# Patient Record
Sex: Male | Born: 1950 | Race: White | Hispanic: No | Marital: Single | Smoking: Former smoker
Health system: Southern US, Community
[De-identification: ages and names within clinical notes are randomized; demographics above are authoritative.]

---

## 2008-05-30 ENCOUNTER — Emergency Department (HOSPITAL_COMMUNITY): Admission: EM | Admit: 2008-05-30 | Discharge: 2008-05-30 | Payer: Self-pay | Admitting: Internal Medicine

## 2010-06-12 ENCOUNTER — Observation Stay (HOSPITAL_COMMUNITY)
Admission: EM | Admit: 2010-06-12 | Discharge: 2010-06-14 | Payer: Self-pay | Source: Home / Self Care | Attending: Internal Medicine | Admitting: Internal Medicine

## 2010-06-15 LAB — PROTIME-INR
INR: 1.03 (ref 0.00–1.49)
Prothrombin Time: 13.7 seconds (ref 11.6–15.2)

## 2010-06-15 LAB — HEMOGLOBIN A1C
Hgb A1c MFr Bld: 6.2 % — ABNORMAL HIGH (ref <5.7)
Mean Plasma Glucose: 131 mg/dL — ABNORMAL HIGH (ref <117)

## 2010-06-15 LAB — URINALYSIS, ROUTINE W REFLEX MICROSCOPIC
Bilirubin Urine: NEGATIVE
Hgb urine dipstick: NEGATIVE
Ketones, ur: NEGATIVE mg/dL
Nitrite: NEGATIVE
Protein, ur: NEGATIVE mg/dL
Specific Gravity, Urine: 1.027 (ref 1.005–1.030)
Urine Glucose, Fasting: NEGATIVE mg/dL
Urobilinogen, UA: 1 mg/dL (ref 0.0–1.0)
pH: 7 (ref 5.0–8.0)

## 2010-06-15 LAB — POCT CARDIAC MARKERS
CKMB, poc: 2.7 ng/mL (ref 1.0–8.0)
CKMB, poc: 4.7 ng/mL (ref 1.0–8.0)
Myoglobin, poc: 140 ng/mL (ref 12–200)
Myoglobin, poc: 131 ng/mL (ref 12–200)
Troponin i, poc: <0.05 ng/mL (ref 0.00–0.09)
Troponin i, poc: <0.05 ng/mL (ref 0.00–0.09)

## 2010-06-15 LAB — CBC
HCT: 45.1 % (ref 39.0–52.0)
Hemoglobin: 15 g/dL (ref 13.0–17.0)
MCH: 31.7 pg (ref 26.0–34.0)
MCHC: 33.3 g/dL (ref 30.0–36.0)
MCV: 95.3 fL (ref 78.0–100.0)
Platelets: 184 10*3/uL (ref 150–400)
RBC: 4.73 MIL/uL (ref 4.22–5.81)
RDW: 13.5 % (ref 11.5–15.5)
WBC: 6.8 10*3/uL (ref 4.0–10.5)

## 2010-06-15 LAB — COMPREHENSIVE METABOLIC PANEL
ALT: 23 U/L (ref 0–53)
AST: 25 U/L (ref 0–37)
Albumin: 3.7 g/dL (ref 3.5–5.2)
Alkaline Phosphatase: 44 U/L (ref 39–117)
BUN: 16 mg/dL (ref 6–23)
CO2: 30 mEq/L (ref 19–32)
Calcium: 9.2 mg/dL (ref 8.4–10.5)
Chloride: 104 mEq/L (ref 96–112)
Creatinine, Ser: 0.94 mg/dL (ref 0.4–1.5)
GFR calc Af Amer: >60 mL/min (ref >60)
GFR calc non Af Amer: >60 mL/min (ref >60)
Glucose, Bld: 106 mg/dL — ABNORMAL HIGH (ref 70–99)
Potassium: 4 mEq/L (ref 3.5–5.1)
Sodium: 141 mEq/L (ref 135–145)
Total Bilirubin: 0.5 mg/dL (ref 0.3–1.2)
Total Protein: 7.4 g/dL (ref 6.0–8.3)

## 2010-06-15 LAB — CARDIAC PANEL(CRET KIN+CKTOT+MB+TROPI)
CK, MB: 3.4 ng/mL (ref 0.3–4.0)
CK, MB: 3.2 ng/mL (ref 0.3–4.0)
CK, MB: 2.6 ng/mL (ref 0.3–4.0)
Relative Index: 1.5 (ref 0.0–2.5)
Relative Index: 1.6 (ref 0.0–2.5)
Relative Index: 1.5 (ref 0.0–2.5)
Total CK: 220 U/L (ref 7–232)
Total CK: 200 U/L (ref 7–232)
Total CK: 172 U/L (ref 7–232)
Troponin I: 0.01 ng/mL (ref 0.00–0.06)
Troponin I: 0.01 ng/mL (ref 0.00–0.06)
Troponin I: 0.01 ng/mL (ref 0.00–0.06)

## 2010-06-15 LAB — DIFFERENTIAL
Basophils Absolute: 0 10*3/uL (ref 0.0–0.1)
Basophils Relative: 0 % (ref 0–1)
Eosinophils Absolute: 0.2 10*3/uL (ref 0.0–0.7)
Eosinophils Relative: 3 % (ref 0–5)
Lymphocytes Relative: 33 % (ref 12–46)
Lymphs Abs: 2.3 10*3/uL (ref 0.7–4.0)
Monocytes Absolute: 0.9 10*3/uL (ref 0.1–1.0)
Monocytes Relative: 13 % — ABNORMAL HIGH (ref 3–12)
Neutro Abs: 3.4 10*3/uL (ref 1.7–7.7)
Neutrophils Relative %: 51 % (ref 43–77)

## 2010-06-15 LAB — GLUCOSE, CAPILLARY: Glucose-Capillary: 111 mg/dL — ABNORMAL HIGH (ref 70–99)

## 2010-06-15 LAB — LIPID PANEL
Cholesterol: 124 mg/dL (ref 0–200)
HDL: 36 mg/dL — ABNORMAL LOW (ref >39)
LDL Cholesterol: 72 mg/dL (ref 0–99)
Total CHOL/HDL Ratio: 3.4 RATIO
Triglycerides: 82 mg/dL (ref <150)
VLDL: 16 mg/dL (ref 0–40)

## 2010-06-15 LAB — HOMOCYSTEINE: Homocysteine: 9.2 umol/L (ref 4.0–15.4)

## 2010-06-15 LAB — APTT: aPTT: 34 seconds (ref 24–37)

## 2010-06-19 NOTE — Discharge Summary (Signed)
[name], [name] NO.:  [id number]  MEDICAL RECORD NO.:  [id number]          PATIENT TYPE:  INP  LOCATION:  1441                         FACILITY:  [facility]  PHYSICIAN:  Ruthy Dick, MD    DATE OF BIRTH:  [birth year]  DATE OF ADMISSION:  06/12/2010 DATE OF DISCHARGE:  06/14/2010                              DISCHARGE SUMMARY   PRIMARY CARE PHYSICIAN:  Ronda Fairly. Passero, MD  REASON FOR ADMISSION:  Possible transient ischemic attack.  FINAL DISCHARGE DIAGNOSES: 1. Transient ischemic attack. 2. Diet-controlled diabetes mellitus. 3. Hypertension. 4. Hyperlipidemia. 5. Obesity. 6. Chronic obstructive pulmonary disease.  PROCEDURES DONE DURING THIS ADMISSION: 1. CT scan of the head without contrast shows no acute abnormalities. 2. Chest x-ray showed bronchitic changes but no acute abnormalities as     well. 3. An x-ray of the eye was done and showed no radiopaque foreign body     in the orbit. 4. MRI of the brain was done and was read as incomplete study but no     significant abnormality was detected. 5. CT angiogram of the neck was done and was read as unremarkable CTA     exam of the neck. 6. 2-D echocardiogram was done and was read as having a 55-60%     ejection fraction with no wall motion abnormalities.  There was a     little bit of increased thickness of the septum consistent with     lipomatous hypertrophy.  CONSULT DURING THIS ADMISSION:  Neurology.  BRIEF HISTORY OF PRESENT ILLNESS AND HOSPITAL COURSE:  Mr. [name] is a pleasant 60 year old Caucasian male with a past medical history significant for COPD and dyslipidemia who came in because of having numbness, weakness to the left upper extremity and left lower extremity. This lasted for 5 minutes and because of this he came to the hospital. On arrival into the hospital his symptoms were resolved but he was admitted for further evaluation for possible TIA versus a stroke.  As noted  above, all the workup came back negative without showing any clear sign of stroke.  We still believe that the patient may have had a transient ischemia attack and because he was already on aspirin at home, the neurologists have recommended that the patient should now be on Plavix.  We recommend this patient to follow up with neurologist, Dr. Pearlean Brownie, in the outpatient to evaluate further as to whether to continue with the Plavix or to revert back to aspirin.  In any case, the patient's symptoms continued to be resolved.  No recurrence since admission to the hospital.  Today, he continues to have no complaints whatsoever.  No chest pain.  No shortness of breath.  No abdominal pain. No nausea or vomiting.  No diarrhea or constipation.  PHYSICAL EXAMINATION:  VITAL SIGNS:  Temperature of 97.8, pulse 73, respiration 20, blood pressure 130/78, saturating 94% on room air. HEENT: Normocephalic, atraumatic.  Pupils equal, round, reactive to light.  CHEST:  Clear to auscultation bilaterally.  ABDOMEN:  Soft, nontender.  EXTREMITIES:  No clubbing, cyanosis or edema. CARDIOVASCULAR:  First and second heart sounds heard.  NEUROLOGIC:  Nonfocal.  Cranial nerves intact II-XII.  The patient has normal reflexes.  DISCHARGE MEDICATIONS: 1. Plavix 75 mg daily. 2. Advair 1 puff daily. 3. Fish oil 2 tablets b.i.d. 4. Zocor 40 mg daily. 5. Spiriva 1 puff daily.  FOLLOWUP:  The patient is to follow up with Dr. Gentry Roch of [facility] in about 1 to 2 weeks and he is to call for this appointment.  He is also to follow up with Dr. Pearlean Brownie, the neurologist, in 2 to 3 weeks.  Plan of care discussed in detail with the patient at length.  He is in agreement and plans to comply.  Total time used for discharging this patient is 40 minutes.     Ruthy Dick, MD     GU/MEDQ  D:  06/14/2010  T:  06/14/2010  Job:  [id number]  cc:   Ilean China [facility] [address]  [address] [place], [place] [address]  Electronically Signed by Ruthy Dick  on 06/18/2010 06:54:19 PM

## 2010-06-27 NOTE — Consult Note (Signed)
NAMERAND, ETCHISON NO.:  [id number]  MEDICAL RECORD NO.:  [id number]          PATIENT TYPE:  INP  LOCATION:  1441                         FACILITY:  [facility]  PHYSICIAN:  Thana Farr, MD    DATE OF BIRTH:  29-Apr-1951  DATE OF CONSULTATION:  06/14/2010 DATE OF DISCHARGE:                                CONSULTATION   REFERRING PHYSICIAN:  Dr. Butler Denmark.  HISTORY:  Mr. Schlender is a 60 year old male who had episode on the day of admission lasting approximately 5 minutes of left-sided numbness and weakness.  Symptoms resolved on their own.  The patient has not had recurrence of symptoms.  The patient presented for evaluation.  PAST MEDICAL HISTORY: 1. COPD. 2. Hypercholesterolemia.  MEDICATIONS AT HOME: 1. Aspirin. 2. Xanax. 3. Lovenox. 4. Advair. 5. Lovaza. 6. Zocor. 7. Spiriva.  SOCIAL HISTORY:  The patient quit smoking approximately 25 years ago. There is no history of alcohol or illicit drug abuse.  He is employed.  PHYSICAL EXAMINATION:  VITAL SIGNS:  Blood pressure 118/81, heart rate 59, respiratory rate 18, T-max 98.0. ON MENTAL STATUS TESTING:  The patient is alert and oriented.  Follows commands without difficulty.  Speech is fluent.  On cranial nerve testing: II:  Discs flat bilaterally.  Visual fields grossly intact. III, IV, VI:  Extraocular movements intact.  Pupils reactive bilaterally.  V and VII:  Smile symmetric.  VIII:  Grossly intact.  IX and VII:  Positive gag.  XI:  Bilateral shoulder shrug.  XII:  Midline tongue extension.  On motor exam, the patient has 5/5 strength bilaterally.  There was normal tone and bulk.  SENSORY:  Pinprick and light touch are intact bilaterally.  Deep tendon reflexes are 2+ in the upper extremities, absent in the left lower extremity.  There is a 1+ right ankle jerk and there is 1+ right knee jerk.  Ankle jerk is absent as well.  Plantars are equivocal bilaterally.  On cerebellar testing, finger-to-nose  and heel-to-shin intact.  LABORATORY DATA:  CBC normal.  PT/INR, PTT within normal limits.  CBG 111. BMET is significant for a chloride of 106.  Hemoglobin A1c is 6.2 and troponins are negative.  MRI scan was performed of the brain, although it was suboptimal study.  No acute changes were noted.  ASSESSMENT:  Mr. Bernstein is a 60 year old male who presents with an episode of left-sided numbness and weakness.  Description is consistent with a transient ischemic attack.  PLAN: 1. The patient is on aspirin a day at home.  We will change from     aspirin to Plavix. 2. CTA to rule out any significant carotid stenosis.  If significant     stenosis is noted, vascular will need to be involved. 3. At discharge, the patient should follow up with Dr. Pearlean Brownie as an     outpatient.  Echo can be performed as an outpatient as well.          ______________________________ Thana Farr, MD     LR/MEDQ  D:  06/14/2010  T:  06/14/2010  Job:  [id number]  Electronically Signed by Thana Farr MD on 06/27/2010  08:54:10 AM

## 2011-12-16 IMAGING — CT CT ANGIO NECK
2 of 10 series · 10 of 46 positions shown, 16 images · IV contrast (APPLIED)
Comparison: None

CLINICAL DATA: TIA, episode of left side numbness and weakness
which resolved, question stroke; patient unable to tolerate MRI

CT ANGIOGRAPHY NECK
TECHNIQUE: Multidetector CT imaging of the neck was performed
using the standard protocol during bolus administration of
intravenous contrast.  Multiplanar CT image reconstructions
including MIPs were obtained to evaluate the vascular anatomy.
Carotid stenosis measurements (when applicable) are obtained
utilizing NASCET criteria, using the distal internal carotid
diameter as the denominator.
Contrast:  100 ml 1mnipaque-UCG IV

[Series 8: (person_name) 2.0 b30f · axial · 0.39mm/px · z∈[-276,-90]mm · 8 of 121 slices shown, 13 images]
[im 14/121  soft-tissue]
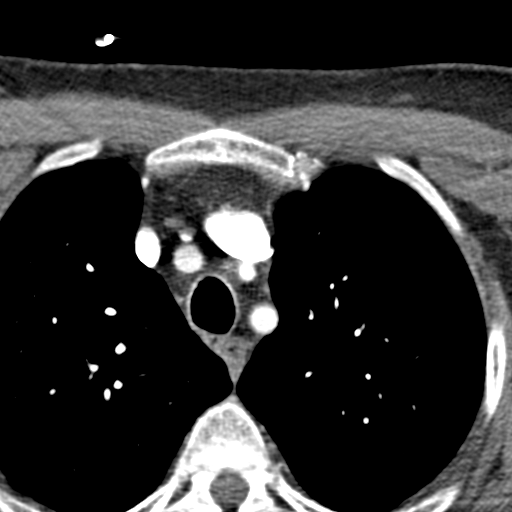
[im 14/121  bone]
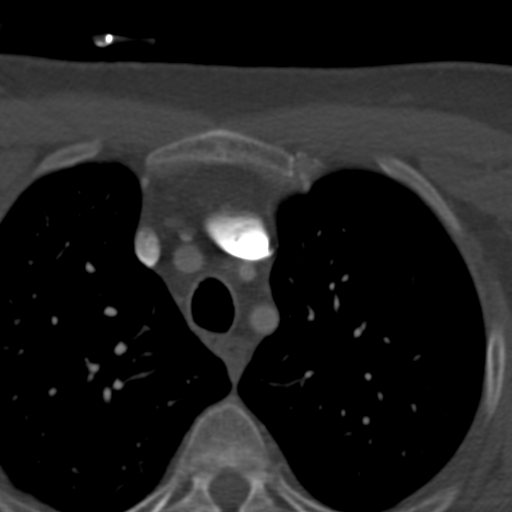
[im 27/121  soft-tissue]
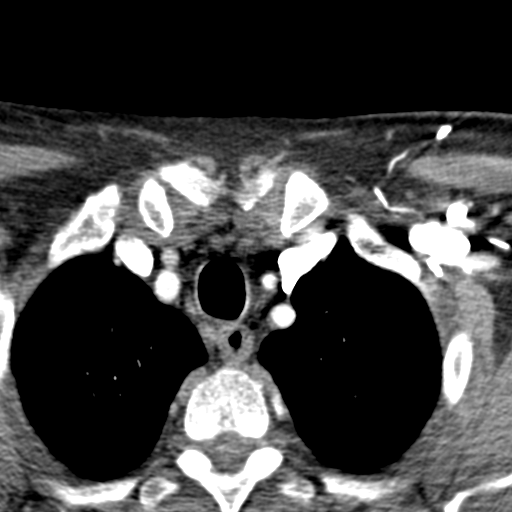
[im 41/121  soft-tissue]
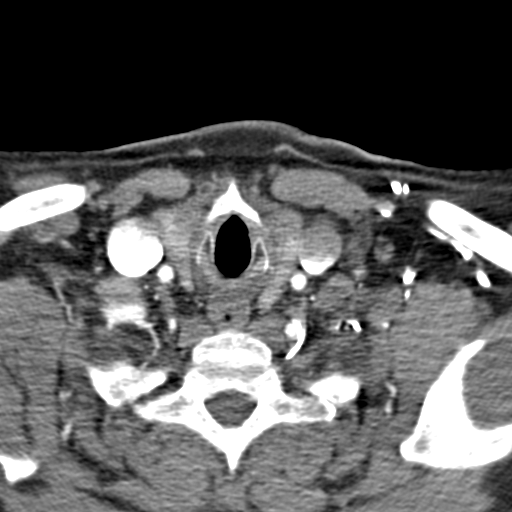
[im 54/121  soft-tissue]
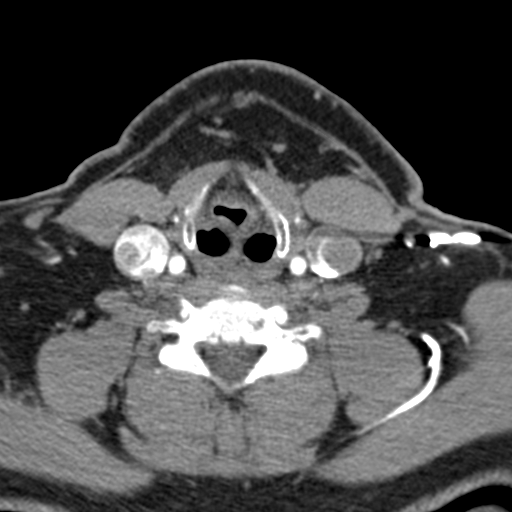
[im 67/121  soft-tissue]
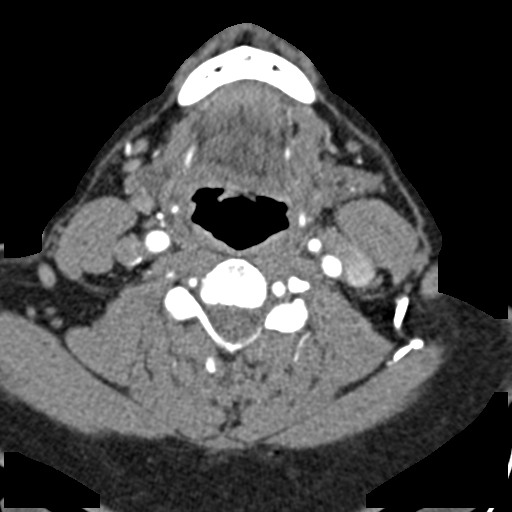
[im 67/121  lung]
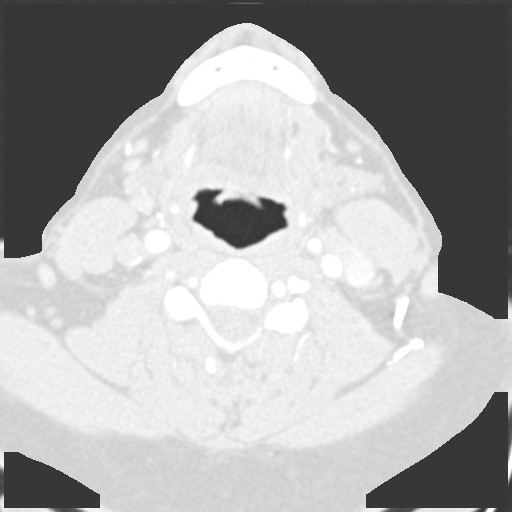
[im 81/121  soft-tissue]
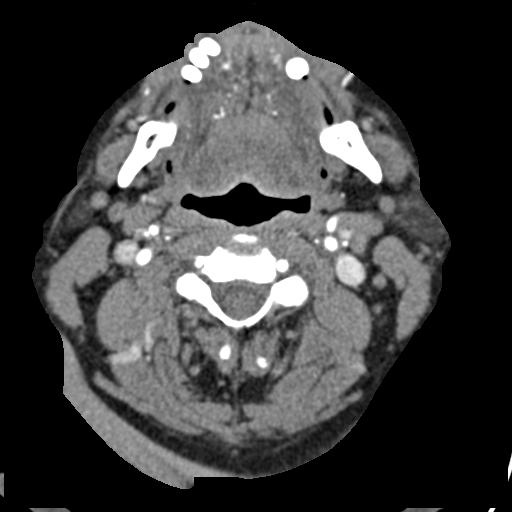
[im 81/121  lung]
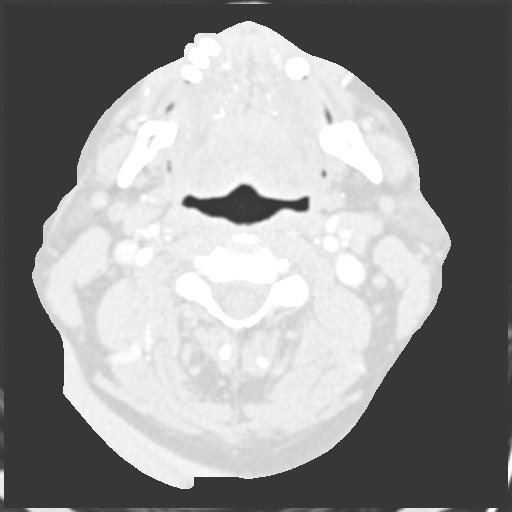
[im 94/121  soft-tissue]
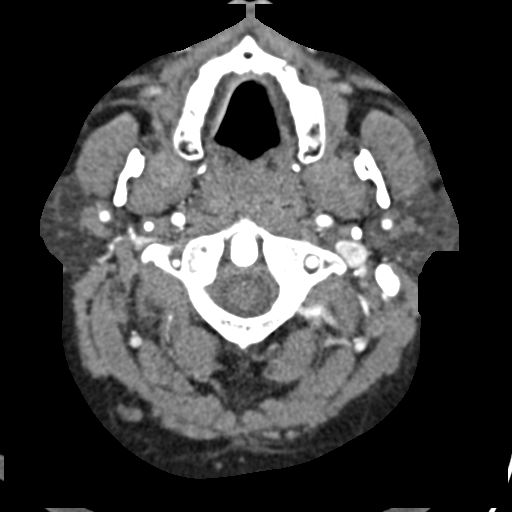
[im 94/121  lung]
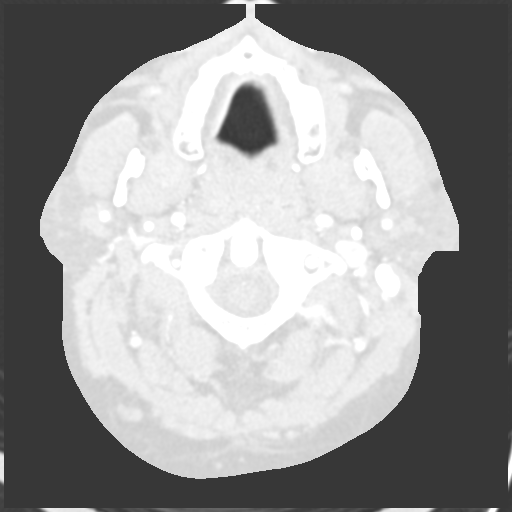
[im 107/121  soft-tissue]
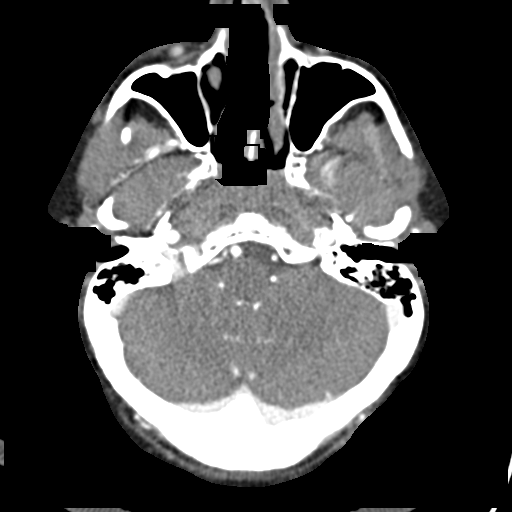
[im 107/121  lung]
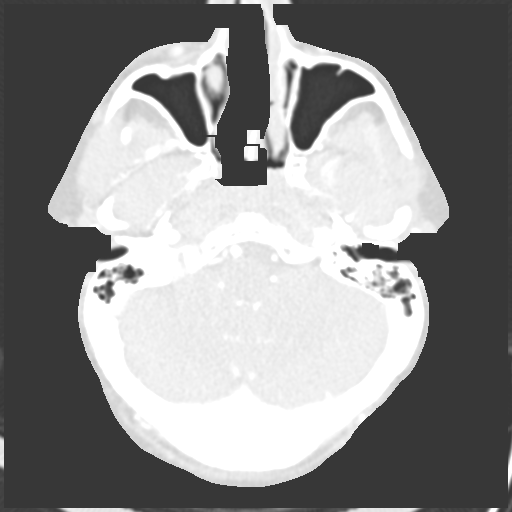

[Series 602: coronal neck wo · coronal · 0.43mm/px · 2 of 84 slices shown, 3 images]
[im 28/84  soft-tissue]
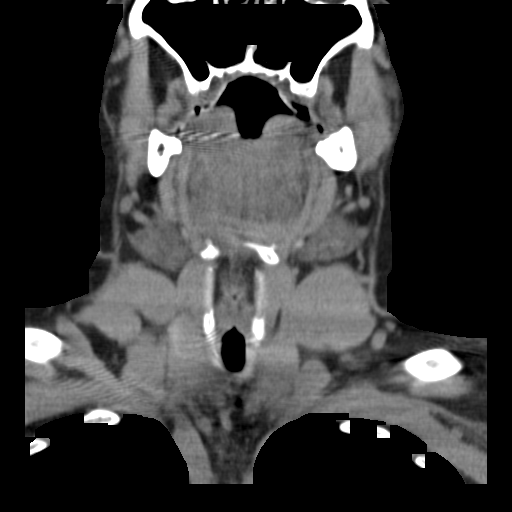
[im 28/84  bone]
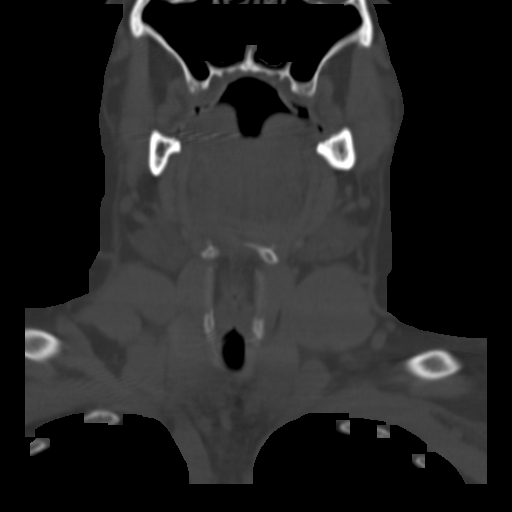
[im 56/84  soft-tissue]
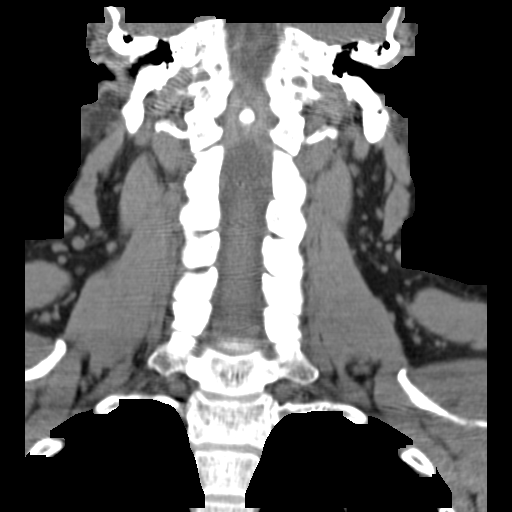

[10 of 46 positions shown; findings below may reference images not displayed]

FINDINGS: On noncontrast images, no high attenuation thrombus is identified
within the major arterial structures.
Scattered normal-sized lymph nodes are seen in the cervical regions
bilaterally.
Symmetric appearance of parotid, thyroid and submandibular glands.
Following IV contrast administration, normal enhancement of the
aortic arch and the great vessels of the neck is seen.
Common carotid arteries, internal carotid arteries, external
carotid arteries, and vertebral arteries are patent without plaque
formation or stenosis.
Internal carotid and vertebral arteries are normal in caliber and
course to the skull base.
Left vertebral dominant system.
Reflux of a small amount of contrast into the distal aspects of the
internal jugular veins bilaterally.
Pulmonary artery branches in the upper lobes are patent.
Visualized upper lungs are clear.
No acute osseous abnormalities.
Visualized intracranial structures unremarkable for technique.
Visualized portion of aortic arch normal appearance.

Review of the MIP images confirms the above findings.
IMPRESSION: Unremarkable CTA exam of the neck.

## 2015-09-11 ENCOUNTER — Ambulatory Visit: Payer: Worker's Compensation

## 2015-09-11 ENCOUNTER — Ambulatory Visit (INDEPENDENT_AMBULATORY_CARE_PROVIDER_SITE_OTHER): Payer: Worker's Compensation | Admitting: Family Medicine

## 2015-09-11 VITALS — BP 124/70 | HR 79 | Temp 97.9°F | Resp 18 | Ht 66.0 in | Wt 207.0 lb

## 2015-09-11 DIAGNOSIS — M545 Low back pain, unspecified: Secondary | ICD-10-CM

## 2015-09-11 LAB — POCT URINALYSIS DIP (MANUAL ENTRY)
Bilirubin, UA: NEGATIVE
Blood, UA: NEGATIVE
Glucose, UA: NEGATIVE
Ketones, POC UA: NEGATIVE
Leukocytes, UA: NEGATIVE
Nitrite, UA: NEGATIVE
Protein Ur, POC: NEGATIVE
Spec Grav, UA: 1.025
Urobilinogen, UA: 0.2
pH, UA: 5.5

## 2015-09-11 LAB — POC MICROSCOPIC URINALYSIS (UMFC): Mucus: ABSENT

## 2015-09-11 MED ORDER — DICLOFENAC SODIUM 75 MG PO TBEC: 75.0000 mg | DELAYED_RELEASE_TABLET | Freq: Two times a day (BID) | ORAL | Status: AC

## 2015-09-11 NOTE — Progress Notes (Deleted)
    MRN: [id number] DOB: 1950-12-01  Subjective:   [name] is a 65 y.o. male presenting for chief complaint of Fall  Reports ***. Has *** tried *** relief. Denies ***.   [name]'s medications list, allergies, past medical history and past surgical history were reviewed and excluded from this note due to being a worker's comp case.  Objective:   Vitals: BP 124/70 mmHg  Pulse 79  Temp(Src) 97.9 F (36.6 C)  Resp 18  Ht 5\' 6"  (1.676 m)  Wt 207 lb (93.895 kg)  BMI 33.43 kg/m2  SpO2 96%  Physical Exam  No results found for this or any previous visit (from the past 24 hour(s)).  Assessment and Plan :     [name] Erik Fullman, PA-C [facility] [phone] 09/11/2015 9:38 AM

## 2015-09-11 NOTE — Patient Instructions (Addendum)
There is no acute bony injury on the x-rays. Your pneumonia has completely cleared.  I'm giving you some medicine to help with the inflammation in your back. You can return to work but I would advise that you do not lift things over 25 pounds. I like to see him again next Wednesday to see how your coming along.

## 2015-09-11 NOTE — Progress Notes (Signed)
This 65 year old man who works for [employer] and fell into a work. One week ago. He landed in the prone position and has had upper lumbar lower thoracic aching ever since. He's tried Advil but this has not made a difference.  The pain does not radiate. It is worse at night was trying to sleep. He's able to do his job and would not like to have to miss work.  Patient is an ex-smoker. He had a serious pneumonia back in December and is on prednisone now 20 mg along with Spiriva as he recovers from the pneumonia. His primary care doctors and [facility].  Objective:BP 124/70 mmHg  Pulse 79  Temp(Src) 97.9 F (36.6 C)  Resp 18  Ht 5\' 6"  (1.676 m)  Wt 207 lb (93.895 kg)  BMI 33.43 kg/m2  SpO2 96% Patient has no obvious injury on his extremities. He has good range of motion of his shoulders, elbows, wrists, and hands. There are no contusions on his extremities or torso.  Patient indicates his pain is in the paraspinal region of his upper lumbar spine. There is no obvious abnormality of his spine such as scoliosis or kyphosis.  Patient is able to stand up and sit down without difficulty and has good strength in his legs. His lungs are clear to auscultation  [facility] reading (PRIMARY) by  Dr. Milus GlazierLauenstein: Chest x-ray shows no definite infiltrate on the PA though there may be some residual infiltrate best seen anteriorly on the lateral view with some coarse bronchial markings.  Good distal base preservation on the lumbar films with no sign of compression fracture.    Results for orders placed or performed in visit on 09/11/15  POCT urinalysis dipstick  Result Value Ref Range   Color, UA yellow yellow   Clarity, UA clear clear   Glucose, UA negative negative   Bilirubin, UA negative negative   Ketones, POC UA negative negative   Spec Grav, UA 1.025    Blood, UA negative negative   pH, UA 5.5    Protein Ur, POC negative negative   Urobilinogen, UA 0.2    Nitrite, UA Negative Negative   Leukocytes, UA Negative Negative  POCT Microscopic Urinalysis ([facility])  Result Value Ref Range   WBC,UR,HPF,POC Few (A) None WBC/hpf   RBC,UR,HPF,POC None None RBC/hpf   Bacteria None None, Too numerous to count   Mucus Absent Absent   Epithelial Cells, UR Per Microscopy Few (A) None, Too numerous to count cells/hpf   . Assessment: I believe patient has suffered a strain in his upper lumbar region from the fall that he suffered one week ago. I believe this will gradually improve over the next week with anti-inflammatories.  Ears no sign of kidney damage or ongoing lung disease and non-formed the patient of this.  Bilateral low back pain without sciatica - Plan: DG Chest 2 View, DG Lumbar Spine 2-3 Views, POCT urinalysis dipstick, POCT Microscopic Urinalysis ([facility]), diclofenac (VOLTAREN) 75 MG EC tablet follow up one week Elvina SidleKurt Jaskiran Pata, MD
